# Patient Record
Sex: Male | Born: 1990 | Race: White | Hispanic: No | Marital: Single | Smoking: Never smoker
Health system: Southern US, Community
[De-identification: ages and names within clinical notes are randomized; demographics above are authoritative.]

---
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
## 2008-06-25 ENCOUNTER — Emergency Department: Payer: Self-pay | Admitting: Internal Medicine

## 2011-05-21 ENCOUNTER — Emergency Department: Payer: Self-pay | Admitting: Emergency Medicine

## 2011-05-21 LAB — URINALYSIS, COMPLETE
Bacteria: NONE SEEN
Bilirubin,UR: NEGATIVE
Blood: NEGATIVE
Glucose,UR: NEGATIVE mg/dL (ref 0–75)
Ketone: NEGATIVE
Ph: 6 (ref 4.5–8.0)
Protein: NEGATIVE
RBC,UR: 2 /HPF (ref 0–5)
Specific Gravity: 1.027 (ref 1.003–1.030)
Squamous Epithelial: NONE SEEN
WBC UR: 4 /HPF (ref 0–5)

## 2011-05-21 LAB — COMPREHENSIVE METABOLIC PANEL
Albumin: 4.2 g/dL (ref 3.4–5.0)
Alkaline Phosphatase: 60 U/L (ref 50–136)
Anion Gap: 8 (ref 7–16)
BUN: 11 mg/dL (ref 7–18)
Chloride: 105 mmol/L (ref 98–107)
Co2: 29 mmol/L (ref 21–32)
EGFR (African American): >60
EGFR (Non-African Amer.): >60
Glucose: 100 mg/dL — ABNORMAL HIGH (ref 65–99)
Potassium: 3.7 mmol/L (ref 3.5–5.1)
SGOT(AST): 30 U/L (ref 15–37)
SGPT (ALT): 25 U/L
Sodium: 142 mmol/L (ref 136–145)

## 2011-05-21 LAB — CBC
HCT: 43.5 % (ref 40.0–52.0)
MCHC: 33.8 g/dL (ref 32.0–36.0)
Platelet: 203 10*3/uL (ref 150–440)
RBC: 4.75 10*6/uL (ref 4.40–5.90)
WBC: 11.7 10*3/uL — ABNORMAL HIGH (ref 3.8–10.6)

## 2013-07-08 ENCOUNTER — Ambulatory Visit: Payer: Self-pay

## 2013-09-03 IMAGING — CT CT ABD-PELV W/ CM
1 of 2 series · 15 of 32 positions shown, 19 images · non-contrast
Comparison: none

REASON FOR EXAM: (1) Lower abdominal pain; (2) lower abdominal pain;
NOTE: Nursing to Give Don Lolito
COMMENTS:

PROCEDURE:     CT  - CT ABDOMEN / PELVIS  W  - May 21, 2011  [DATE]
RESULT:
TECHNIQUE: Helical 3 mm sections were obtained from the lung bases through
the pubic symphysis status post intravenous administration of 100 mL of
Wsovue-WNN and oral contrast.

[Series 2: appendicitis · axial · 0.70mm/px · z∈[-1216,-787]mm · 15 of 157 slices shown, 19 images]
[im 7/157  soft-tissue]
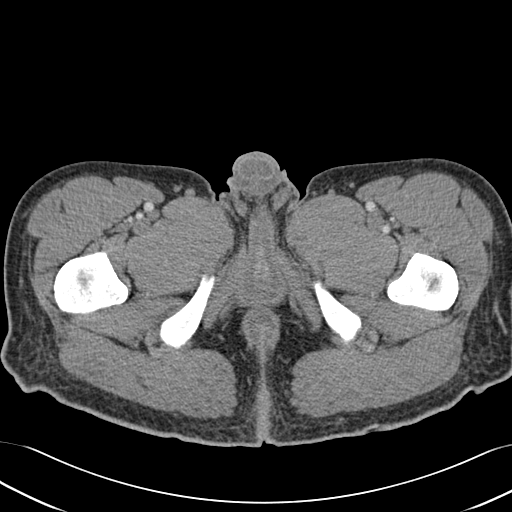
[im 7/157  bone]
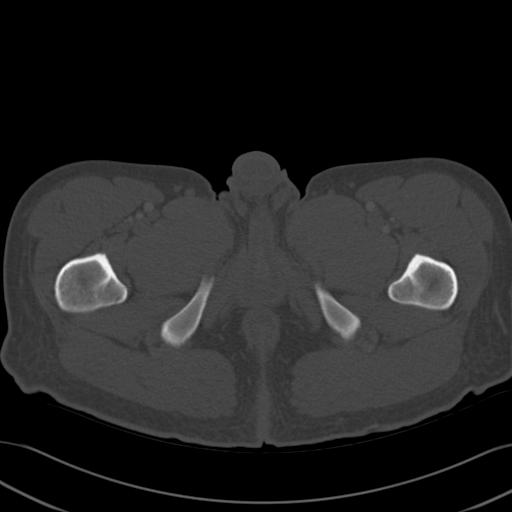
[im 19/157  soft-tissue]
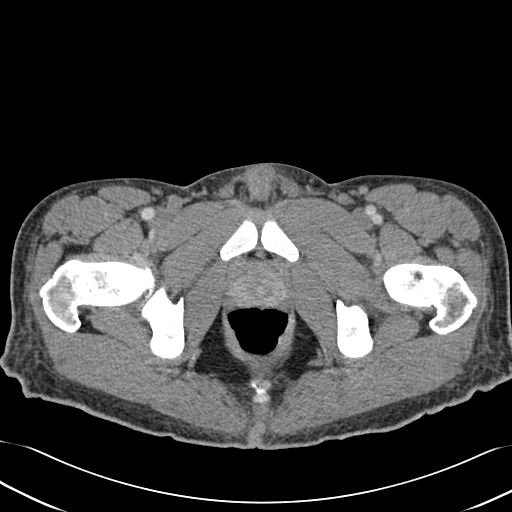
[im 32/157  soft-tissue]
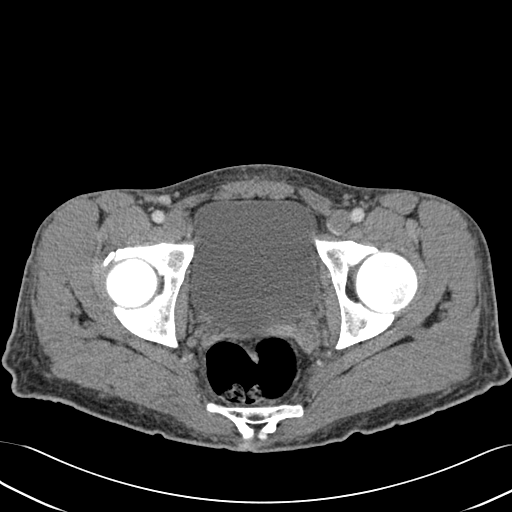
[im 44/157  soft-tissue]
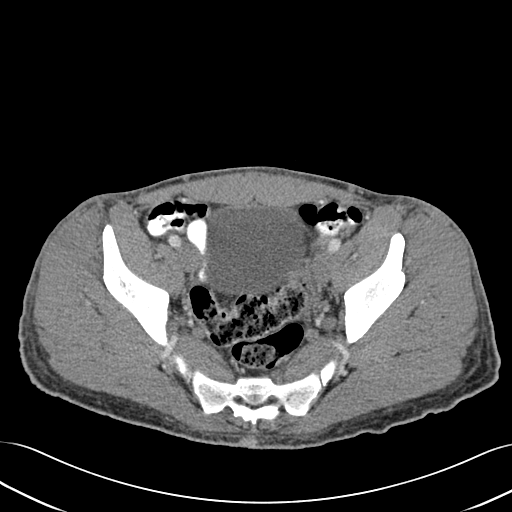
[im 57/157  soft-tissue]
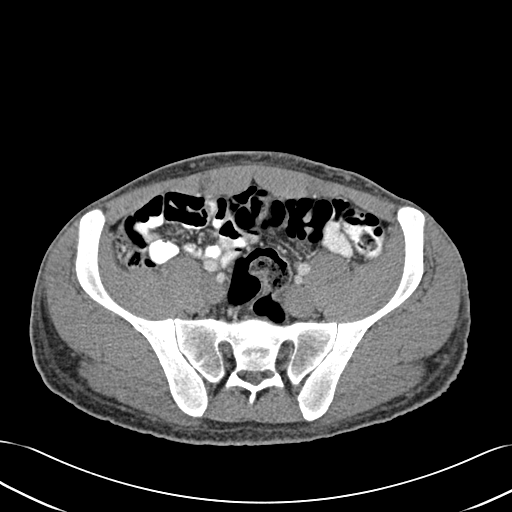
[im 69/157  soft-tissue]
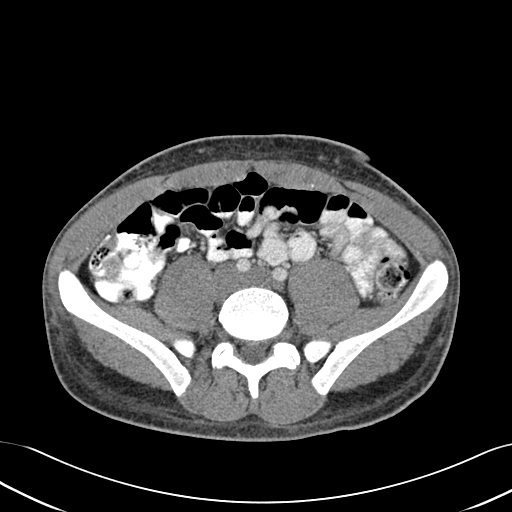
[im 82/157  soft-tissue]
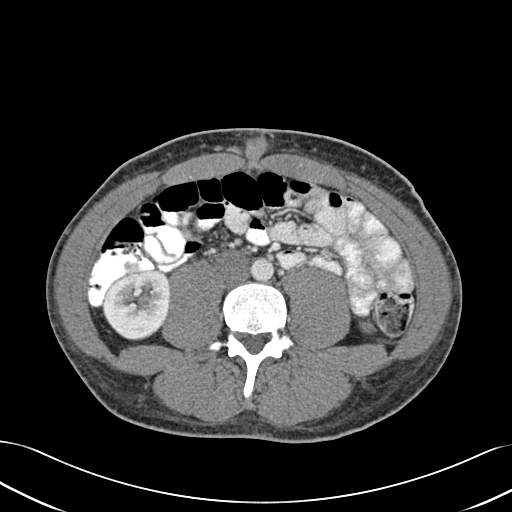
[im 88/157  soft-tissue]
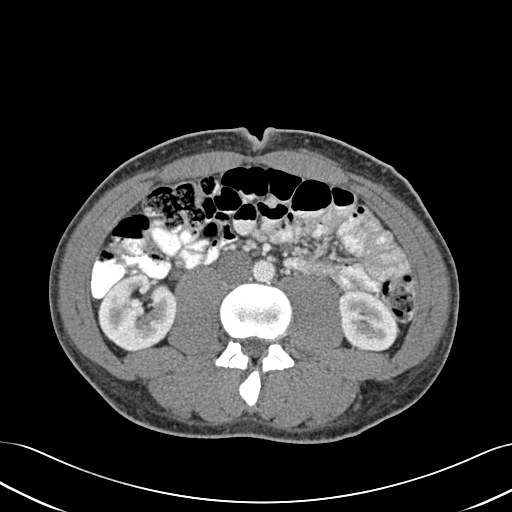
[im 100/157  soft-tissue]
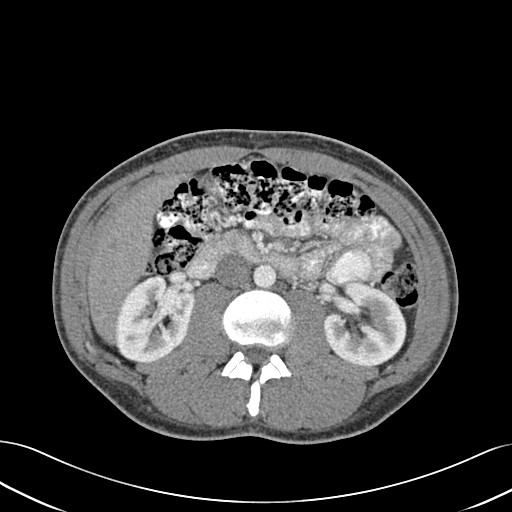
[im 100/157  bone]
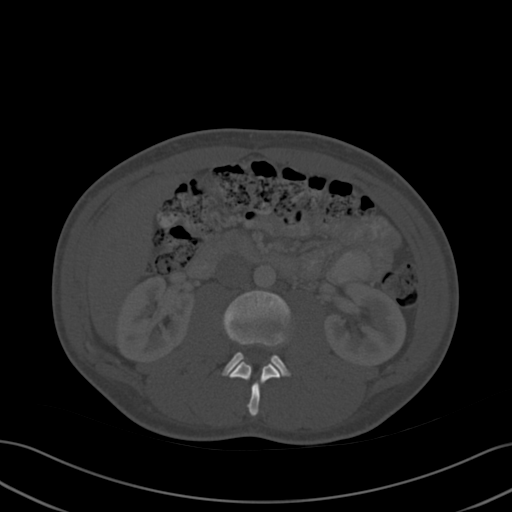
[im 113/157  soft-tissue]
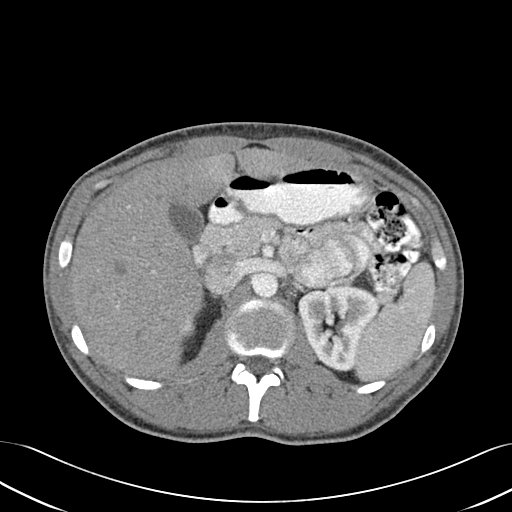
[im 125/157  soft-tissue]
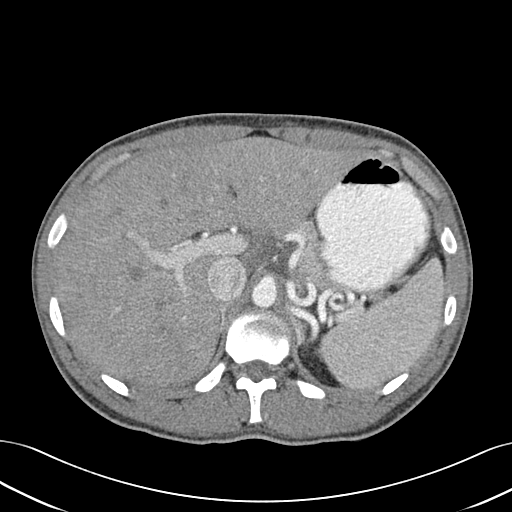
[im 132/157  lung]
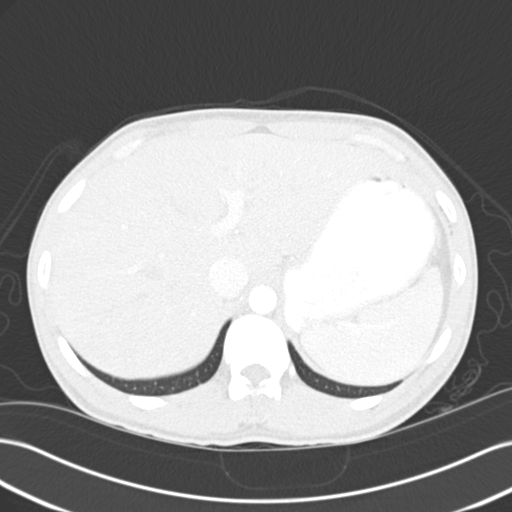
[im 138/157  soft-tissue]
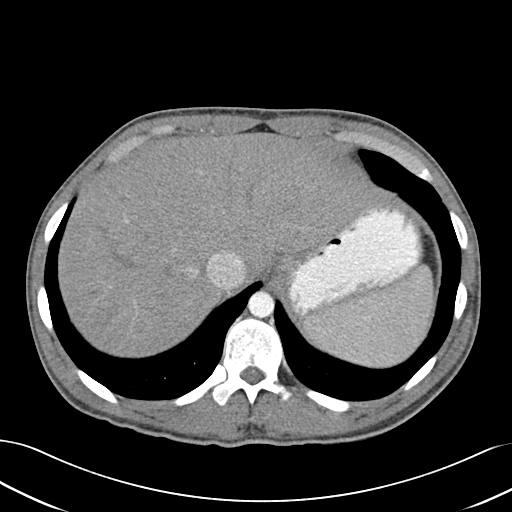
[im 138/157  lung]
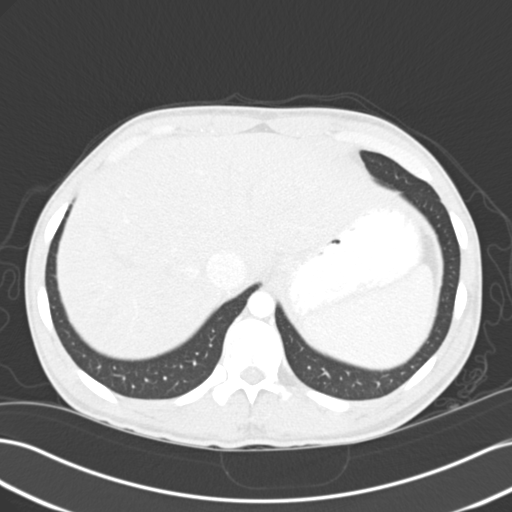
[im 144/157  lung]
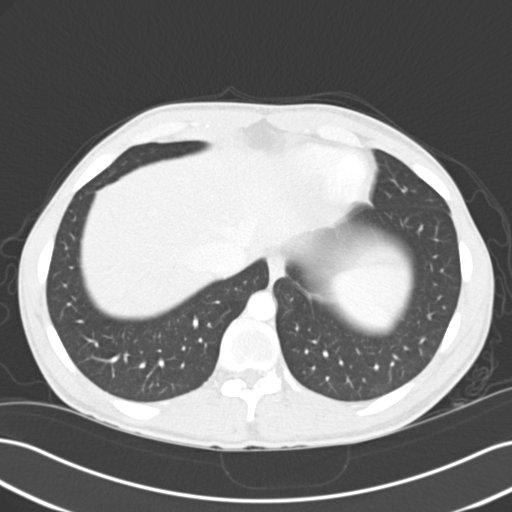
[im 150/157  soft-tissue]
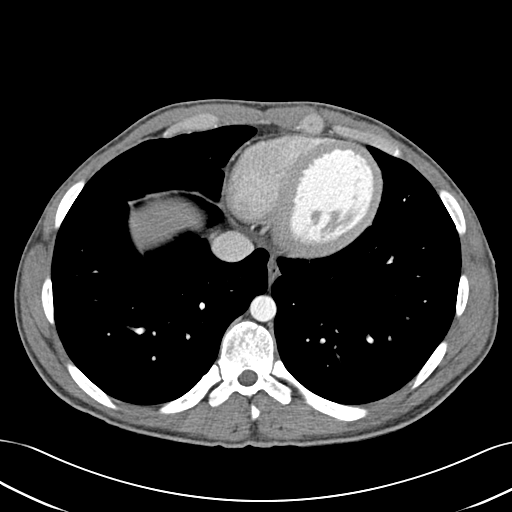
[im 150/157  lung]
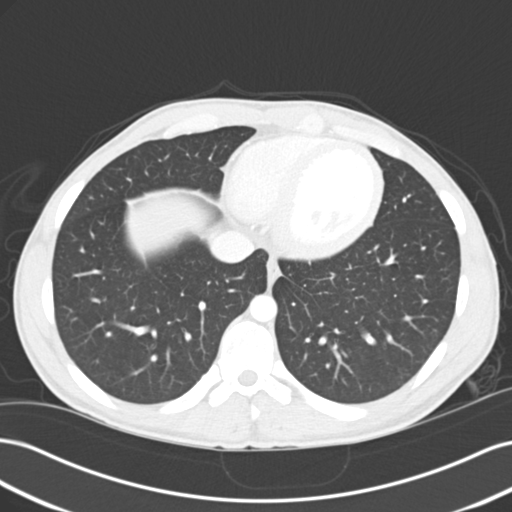

[15 of 32 positions shown; findings below may reference images not displayed]

FINDINGS: The lung bases are unremarkable.

The liver, spleen, adrenals, pancreas, and kidneys are unremarkable. There
is no CT evidence of appendicitis, colitis, diverticulitis, enteritis nor
bowel obstruction. There is no evidence of an abdominal aortic aneurysm. The
appendix is identified and appears unremarkable. There is no evidence of
abdominal or pelvic masses, free fluid nor loculated fluid collections.
IMPRESSION: 1. No CT evidence of obstructive or inflammatory abnormalities.
2. Dr. Puppi of the Emergency Department was informed of these findings
via a preliminary faxed report.

## 2014-01-09 ENCOUNTER — Emergency Department: Payer: Self-pay | Admitting: Emergency Medicine

## 2014-01-30 ENCOUNTER — Emergency Department: Payer: Self-pay | Admitting: Emergency Medicine

## 2016-05-15 ENCOUNTER — Encounter: Payer: Self-pay | Admitting: Emergency Medicine

## 2016-05-15 ENCOUNTER — Emergency Department
Admission: EM | Admit: 2016-05-15 | Discharge: 2016-05-15 | Disposition: A | Discharge status: Home / Self Care | Payer: Self-pay | Attending: Emergency Medicine | Admitting: Emergency Medicine

## 2016-05-15 DIAGNOSIS — K0381 Cracked tooth: Secondary | ICD-10-CM | POA: Insufficient documentation

## 2016-05-15 DIAGNOSIS — K029 Dental caries, unspecified: Secondary | ICD-10-CM | POA: Insufficient documentation

## 2016-05-15 IMAGING — US US SCROTUM W/ DOPPLER COMPLETE
1 series · 14 of 25 positions shown · non-contrast
Comparison: None.

CLINICAL DATA: Testicular pain, right greater than left, x1 day.

EXAM:
SCROTAL ULTRASOUND
DOPPLER ULTRASOUND OF THE TESTICLES
TECHNIQUE: Complete ultrasound examination of the testicles, epididymis, and
other scrotal structures was performed. Color and spectral Doppler
ultrasound were also utilized to evaluate blood flow to the
testicles.

[Series 1: us scrotum w/ doppler complete · 0.06mm/px · 14 of 59 slices shown]
[im 1/59]
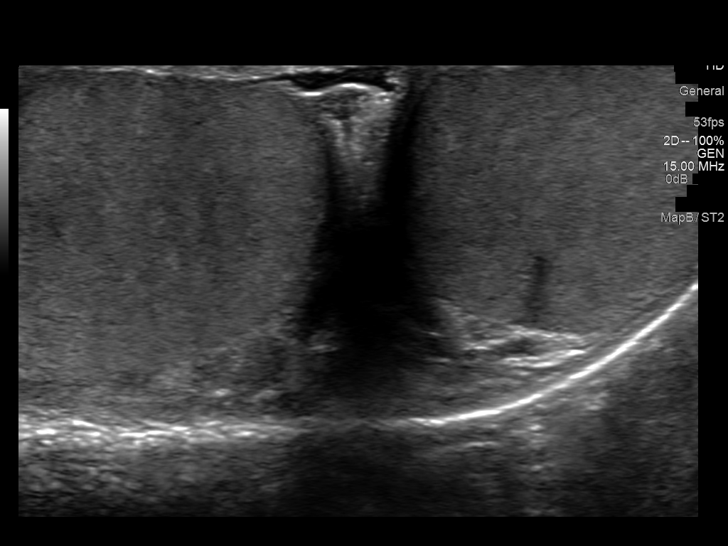
[im 5/59]
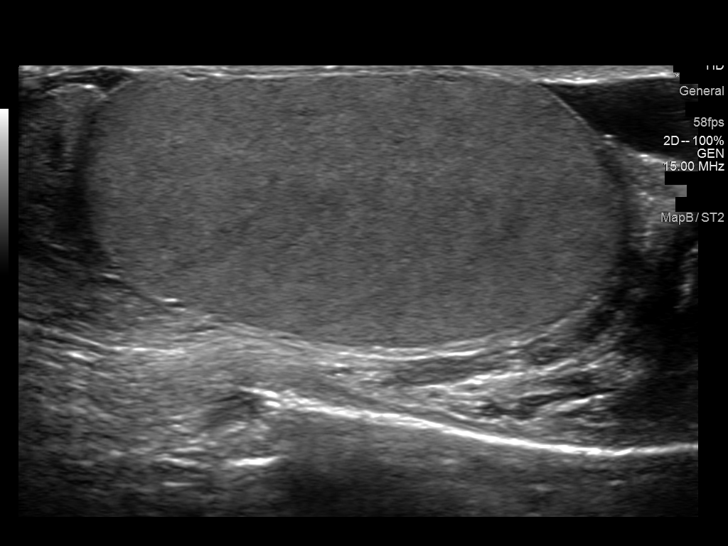
[im 10/59]
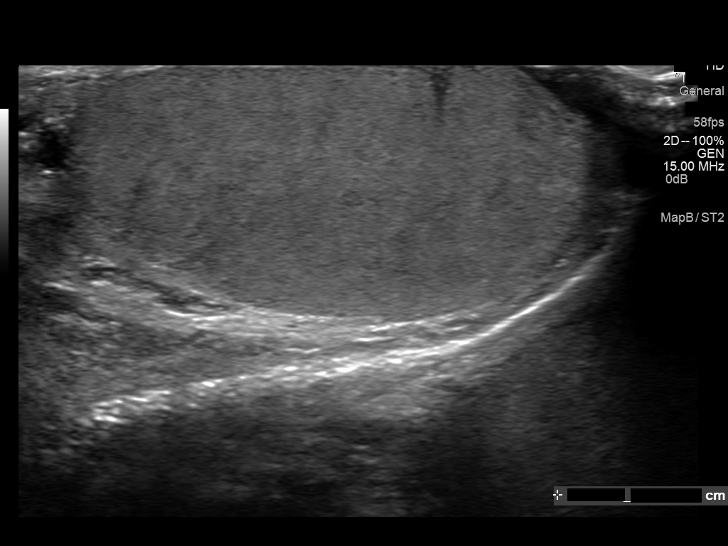
[im 15/59]
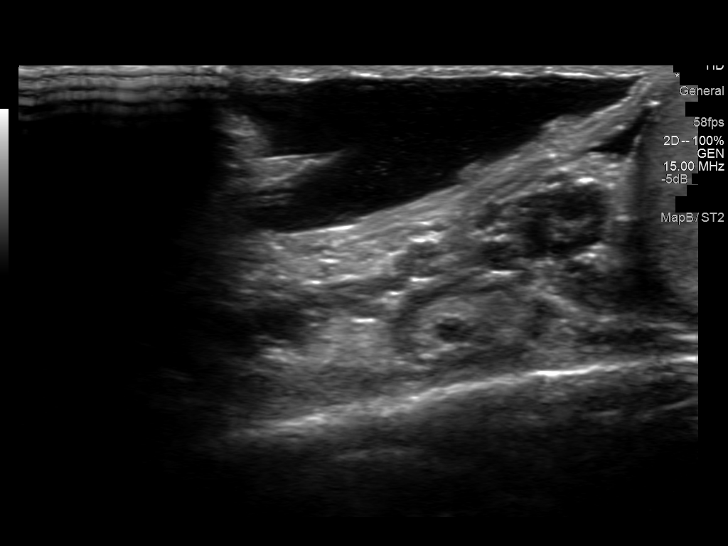
[im 20/59]
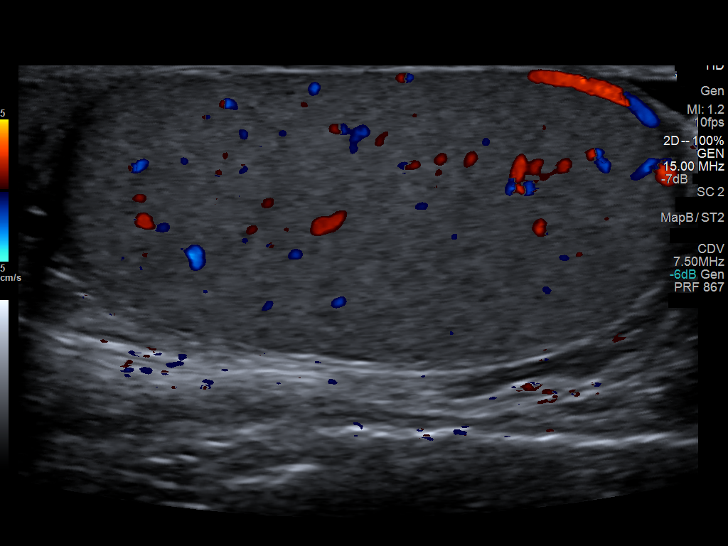
[im 22/59]
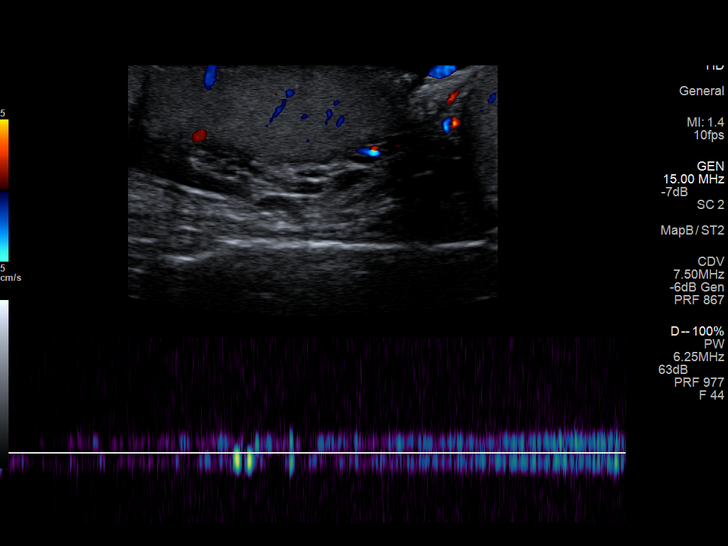
[im 27/59]
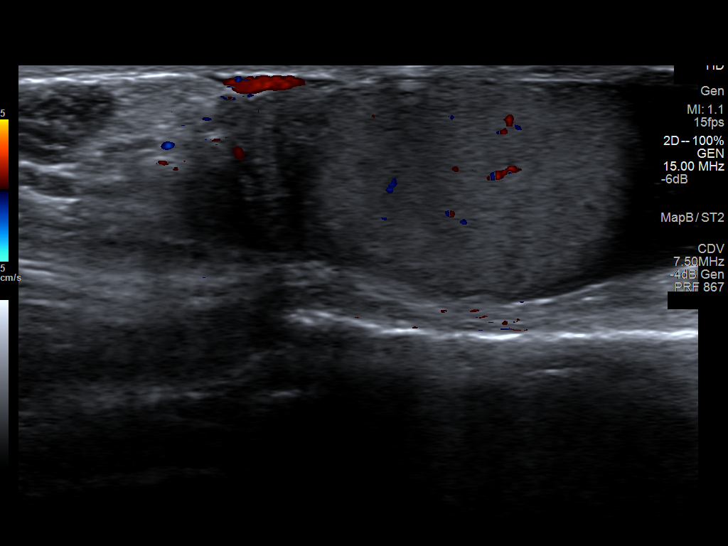
[im 32/59]
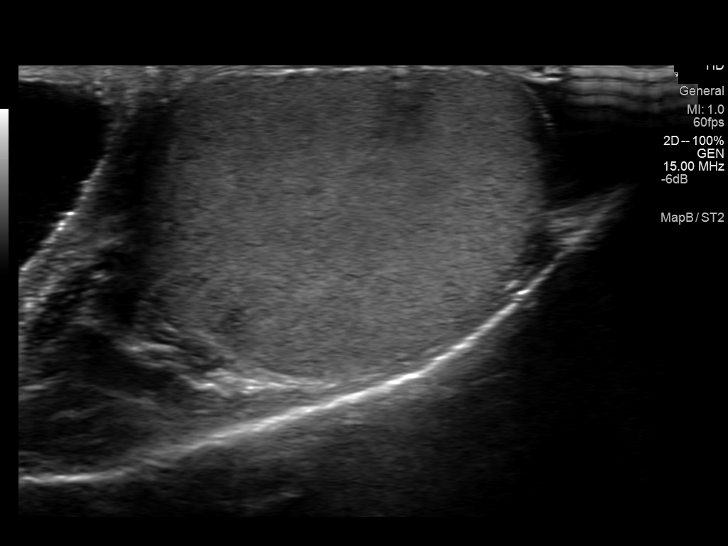
[im 37/59]
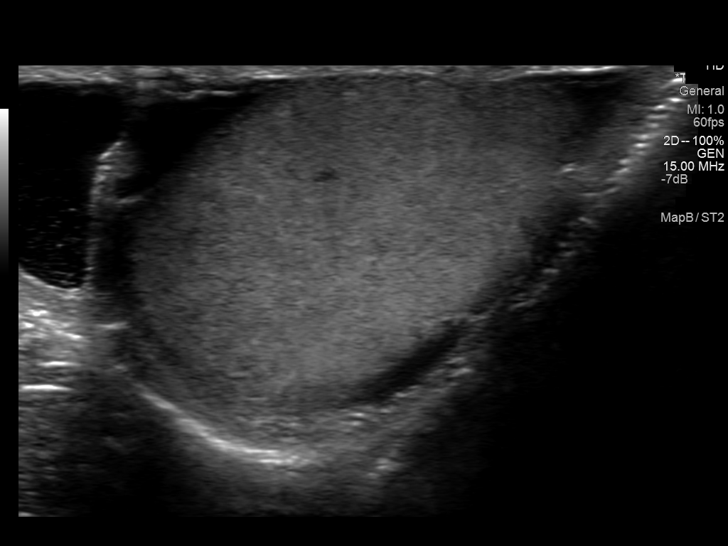
[im 39/59]
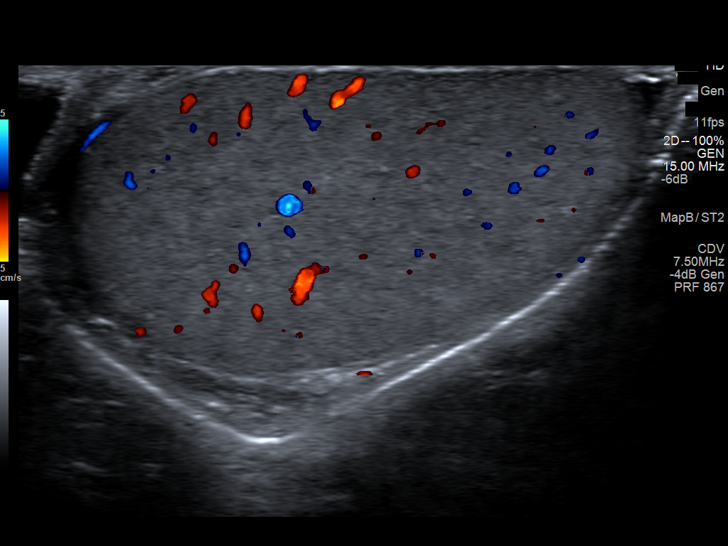
[im 44/59]
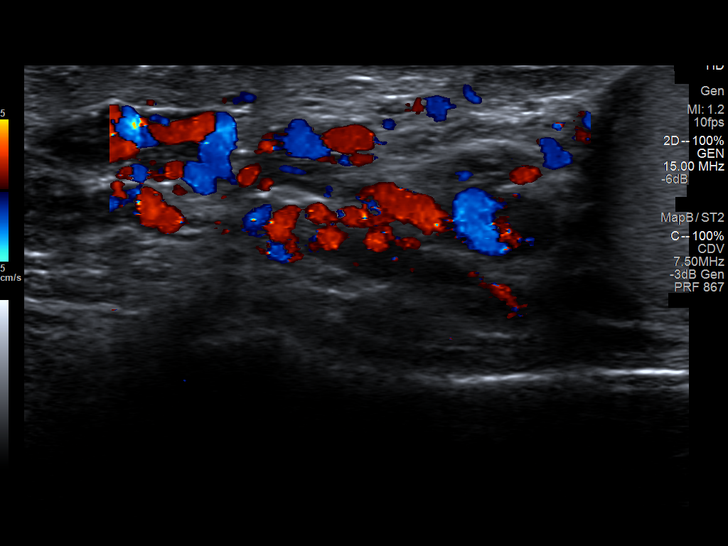
[im 49/59]
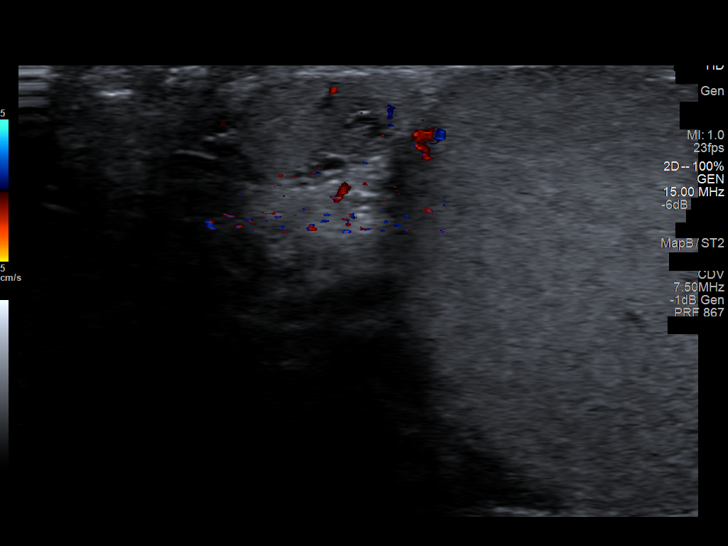
[im 54/59]
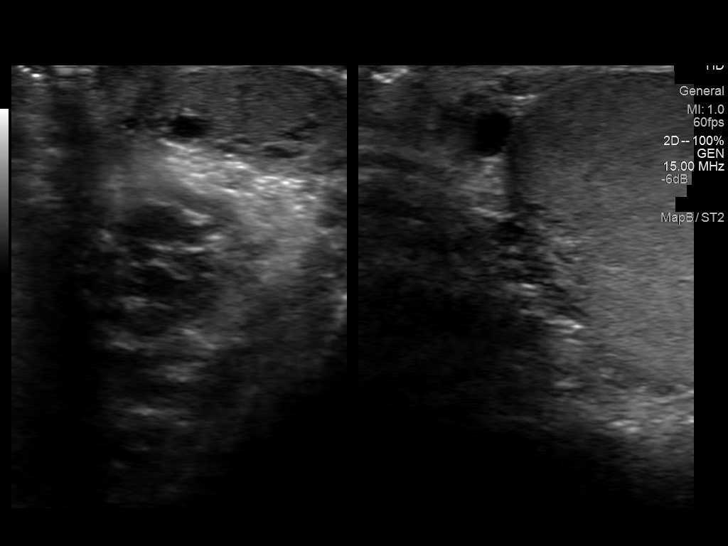
[im 59/59]
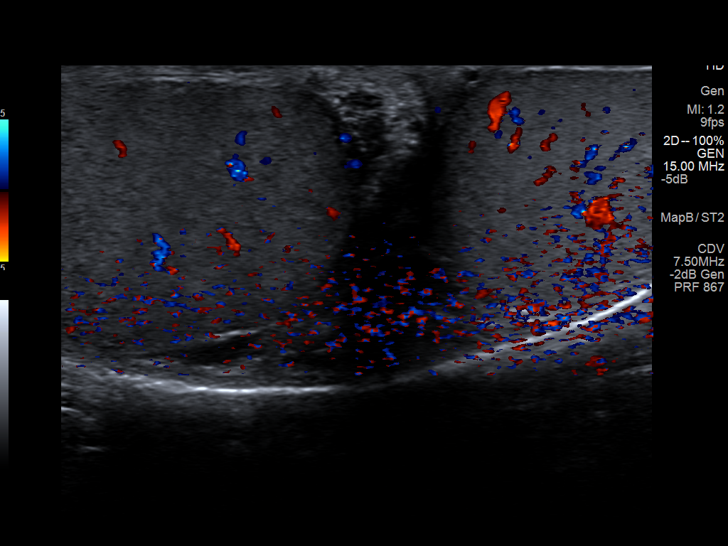

[14 of 25 positions shown; findings below may reference images not displayed]

FINDINGS: Right testicle

Measurements: 4.7 x 2.3 x 3.4 cm. No mass or microlithiasis
visualized.

Left testicle

Measurements: 4.4 x 2.2 x 2.8 cm. No mass or microlithiasis
visualized.

Right epididymis:  Normal in size and appearance.

Left epididymis: Multiple tiny epididymal cysts measuring up to 3
mm.

Hydrocele:  Small bilateral hydroceles, right greater than left.

Varicocele:  Present on the left.

Pulsed Doppler interrogation of both testes demonstrates low
resistance arterial and venous waveforms bilaterally.
IMPRESSION: Normal sonographic appearance of the bilateral testes.

No evidence of testicular torsion.

Small bilateral hydroceles.

Left varicocele.

## 2016-05-15 MED ORDER — TRAMADOL HCL 50 MG PO TABS
50.0000 mg | ORAL_TABLET | Freq: Once | ORAL | Status: AC
  Administered 2016-05-15: 50 mg via ORAL
  Filled 2016-05-15: qty 1

## 2016-05-15 MED ORDER — IBUPROFEN 600 MG PO TABS
600.0000 mg | ORAL_TABLET | Freq: Once | ORAL | Status: AC
  Administered 2016-05-15: 600 mg via ORAL
  Filled 2016-05-15: qty 1

## 2016-05-15 MED ORDER — TRAMADOL HCL 50 MG PO TABS: 50.0000 mg | ORAL_TABLET | Freq: Four times a day (QID) | ORAL | 0 refills | Status: DC | PRN

## 2016-05-15 MED ORDER — LIDOCAINE VISCOUS 2 % MT SOLN
15.0000 mL | Freq: Once | OROMUCOSAL | Status: AC
  Administered 2016-05-15: 15 mL via OROMUCOSAL
  Filled 2016-05-15: qty 15

## 2016-05-15 MED ORDER — AMOXICILLIN 500 MG PO CAPS: 500.0000 mg | ORAL_CAPSULE | Freq: Three times a day (TID) | ORAL | 0 refills | Status: DC

## 2016-05-15 NOTE — ED Notes (Signed)
AAOx3.  Skin warm and dry.  NAD 

## 2016-05-15 NOTE — ED Triage Notes (Signed)
Pt c/o right lower and upper dental pain for 3 weeks. No fevers. No swelling.

## 2016-05-15 NOTE — ED Provider Notes (Signed)
[facility] Emergency Department Provider Note   ____________________________________________   None    (approximate)  I have reviewed the triage vital signs and the nursing notes.   HISTORY  Chief Complaint Dental Pain    HPI Thomas Chambers is a 26 y.o. male . Patient complaining of upper and lower right dental pain for 3 weeks. Patient has a history of devitalized teeth. Patient stated that the hair and of insurance or money to pay for dentist. Patient rates his pain as a 10 over 10. No palliative measures for his complaint.  History reviewed. No pertinent past medical history.  There are no active problems to display for this patient.   History reviewed. No pertinent surgical history.  Prior to Admission medications   Medication Sig Start Date End Date Taking? Authorizing Provider  amoxicillin (AMOXIL) 500 MG capsule Take 1 capsule (500 mg total) by mouth 3 (three) times daily. 05/15/16   Joni Reining, PA-C  traMADol (ULTRAM) 50 MG tablet Take 1 tablet (50 mg total) by mouth every 6 (six) hours as needed for moderate pain. 05/15/16   Joni Reining, PA-C    Allergies Other  History reviewed. No pertinent family history.  Social History Social History  Substance Use Topics  . Smoking status: Never Smoker  . Smokeless tobacco: Never Used  . Alcohol use No    Review of Systems Constitutional: No fever/chills Eyes: No visual changes. ENT: No sore throat.Dental pain Cardiovascular: Denies chest pain. Respiratory: Denies shortness of breath. Gastrointestinal: No abdominal pain.  No nausea, no vomiting.  No diarrhea.  No constipation. Genitourinary: Negative for dysuria. Musculoskeletal: Negative for back pain. Skin: Negative for rash. Neurological: Negative for headaches, focal weakness or numbness.   ____________________________________________   PHYSICAL EXAM:  VITAL SIGNS: ED Triage Vitals  Enc Vitals Group     BP 05/15/16 1500  (!) 146/72     Pulse Rate 05/15/16 1459 71     Resp 05/15/16 1459 16     Temp 05/15/16 1459 97.7 F (36.5 C)     Temp Source 05/15/16 1459 Oral     SpO2 05/15/16 1459 100 %     Weight 05/15/16 1457 200 lb (90.7 kg)     Height 05/15/16 1457  (1.753 m)     Head Circumference --      Peak Flow --      Pain Score 05/15/16 1455 10     Pain Loc --      Pain Edu? --      Excl. in GC? --     Constitutional: Alert and oriented. Well appearing and in no acute distress. Eyes: Conjunctivae are normal. PERRL. EOMI. Head: Atraumatic. Nose: No congestion/rhinnorhea. Mouth/Throat: Mucous membranes are moist.  Oropharynx non-erythematous.Mild gingival edema with multiple caries with fracture of the upper and lower teeth. Neck: No stridor.  No cervical spine tenderness to palpation. Hematological/Lymphatic/Immunilogical: No cervical lymphadenopathy. Cardiovascular: Normal rate, regular rhythm. Grossly normal heart sounds.  Good peripheral circulation. Respiratory: Normal respiratory effort.  No retractions. Lungs CTAB. Gastrointestinal: Soft and nontender. No distention. No abdominal bruits. No CVA tenderness. Musculoskeletal: No lower extremity tenderness nor edema.  No joint effusions. Neurologic:  Normal speech and language. No gross focal neurologic deficits are appreciated. No gait instability. Skin:  Skin is warm, dry and intact. No rash noted. Psychiatric: Mood and affect are normal. Speech and behavior are normal.  ____________________________________________   LABS (all labs ordered are listed, but only abnormal  results are displayed)  Labs Reviewed - No data to display ____________________________________________  EKG   ____________________________________________  RADIOLOGY   ____________________________________________   PROCEDURES  Procedure(s) performed: None  Procedures  Critical Care performed: No  ____________________________________________   INITIAL  IMPRESSION / ASSESSMENT AND PLAN / ED COURSE  Pertinent labs & imaging results that were available during my care of the patient were reviewed by me and considered in my medical decision making (see chart for details).  Dental pain. Patient given viscous lidocaine for swish followed by tramadol and ibuprofen.      ____________________________________________   FINAL CLINICAL IMPRESSION(S) / ED DIAGNOSES  Final diagnoses:  Pain due to dental caries  Patient given discharge care instructions. Patient advised to follow-up at the walk-in dental clinic tomorrow morning. Patient given a prescription for 2 days of tramadol and 10 days for amoxicillin.    NEW MEDICATIONS STARTED DURING THIS VISIT:  New Prescriptions   AMOXICILLIN (AMOXIL) 500 MG CAPSULE    Take 1 capsule (500 mg total) by mouth 3 (three) times daily.   TRAMADOL (ULTRAM) 50 MG TABLET    Take 1 tablet (50 mg total) by mouth every 6 (six) hours as needed for moderate pain.     Note:  This document was prepared using Dragon voice recognition software and may include unintentional dictation errors.    Joni Reining, PA-C 05/15/16 1538    Joni Reining, PA-C 05/15/16 1541    Emily Filbert, MD 05/16/16 [phone]

## 2018-01-31 ENCOUNTER — Encounter: Payer: Self-pay | Admitting: Emergency Medicine

## 2018-01-31 ENCOUNTER — Emergency Department
Admission: EM | Admit: 2018-01-31 | Discharge: 2018-01-31 | Disposition: A | Discharge status: Home / Self Care | Payer: Self-pay | Attending: Emergency Medicine | Admitting: Emergency Medicine

## 2018-01-31 ENCOUNTER — Other Ambulatory Visit: Payer: Self-pay

## 2018-01-31 DIAGNOSIS — L509 Urticaria, unspecified: Secondary | ICD-10-CM | POA: Insufficient documentation

## 2018-01-31 DIAGNOSIS — L508 Other urticaria: Secondary | ICD-10-CM

## 2018-01-31 MED ORDER — PREDNISONE 20 MG PO TABS: 20.0000 mg | ORAL_TABLET | Freq: Two times a day (BID) | ORAL | 0 refills | Status: AC

## 2018-01-31 MED ORDER — METHYLPREDNISOLONE SODIUM SUCC 125 MG IJ SOLR
125.0000 mg | Freq: Once | INTRAMUSCULAR | Status: AC
  Administered 2018-01-31: 125 mg via INTRAVENOUS
  Filled 2018-01-31: qty 2

## 2018-01-31 MED ORDER — CYPROHEPTADINE HCL 4 MG PO TABS
4.0000 mg | ORAL_TABLET | Freq: Three times a day (TID) | ORAL | 0 refills | Status: AC | PRN
Start: 2018-01-31 — End: 2018-02-07

## 2018-01-31 MED ORDER — RANITIDINE HCL 150 MG PO TABS: 150.0000 mg | ORAL_TABLET | Freq: Two times a day (BID) | ORAL | 0 refills | Status: AC

## 2018-01-31 MED ORDER — FAMOTIDINE IN NACL 20-0.9 MG/50ML-% IV SOLN
20.0000 mg | Freq: Once | INTRAVENOUS | Status: AC
Start: 2018-01-31 — End: 2018-01-31
  Administered 2018-01-31: 20 mg via INTRAVENOUS
  Filled 2018-01-31: qty 50

## 2018-01-31 MED ORDER — DIPHENHYDRAMINE HCL 50 MG/ML IJ SOLN
50.0000 mg | Freq: Once | INTRAMUSCULAR | Status: AC
  Administered 2018-01-31: 50 mg via INTRAVENOUS
  Filled 2018-01-31: qty 1

## 2018-01-31 NOTE — ED Triage Notes (Signed)
Hives x 3 days. States started after clothes were washed with stain remover. resp unablored

## 2018-01-31 NOTE — Discharge Instructions (Addendum)
You have been treated for a contact dermatitis due to a chemical exposure. Take the prescription meds as directed. Avoid excessive heat and cold exposure. Follow-up with your provider or return as needed.

## 2018-01-31 NOTE — ED Provider Notes (Signed)
[facility] Emergency Department Provider Note ____________________________________________  Time seen: 1620  I have reviewed the triage vital signs and the nursing notes.  HISTORY  Chief Complaint  Urticaria  HPI Thomas Chambers is a 27 y.o. male who presents to the ED for several hours of itching and hives.  Patient describes onset after he donned some clothing that his girlfriend had used a stain remover product on.  He has had hives that have waxed and waned to the extremities, trunk, and groin region he also describes some itching to the neck and back of the scalp.  He presented this afternoon after he began to experience what he felt was some tightening of his throat.  He denies any shortness of breath, chest pain, or syncope.  He also denies any inability to control his oral secretions.  [facility] without [facility]oking or vomiting.  Denies any known history of skin sensitivity, asthma, allergies, or atopic dermatitis.  History reviewed. No pertinent past medical history.  There are no active problems to display for this patient.  History reviewed. No pertinent surgical history.  Prior to Admission medications   Medication Sig Start Date End Date Taking? Authorizing Provider  cyproheptadine (PERIACTIN) 4 MG tablet Take 1 tablet (4 mg total) by mouth 3 (three) times daily as needed for up to 7 days for allergies. 01/31/18 02/07/18  Kharisma Glasner, Charlesetta IvoryJenise V Bacon, PA-C  predniSONE (DELTASONE) 20 MG tablet Take 1 tablet (20 mg total) by mouth 2 (two) times daily with a meal for 5 days. 01/31/18 02/05/18  Brentin Shin, Charlesetta IvoryJenise V Bacon, PA-C  ranitidine (ZANTAC) 150 MG tablet Take 1 tablet (150 mg total) by mouth 2 (two) times daily for 7 days. 01/31/18 02/07/18  Giulia Hickey, Charlesetta IvoryJenise V Bacon, PA-C    Allergies Other  No family history on file.  Social History Social History   Tobacco Use  . Smoking status: Never Smoker  . Smokeless tobacco: Never Used  Substance Use  Topics  . Alcohol use: No  . Drug use: No    Review of Systems  Constitutional: Negative for fever. Eyes: Negative for visual changes. ENT: Negative for sore throat. Cardiovascular: Negative for chest pain. Respiratory: Negative for shortness of breath. Gastrointestinal: Negative for abdominal pain, vomiting and diarrhea. Genitourinary: Negative for dysuria. Musculoskeletal: Negative for back pain. Skin: Positive for rash. Neurological: Negative for headaches, focal weakness or numbness. ____________________________________________  PHYSICAL EXAM:  VITAL SIGNS: ED Triage Vitals  Enc Vitals Group     BP 01/31/18 1529 (!) 162/93     Pulse Rate 01/31/18 1529 99     Resp 01/31/18 1529 20     Temp 01/31/18 1529 (!) 97.5 F (36.4 C)     Temp Source 01/31/18 1529 Oral     SpO2 01/31/18 1529 100 %     Weight 01/31/18 1530 240 lb (108.9 kg)     Height 01/31/18 1530 5\' 8"  (1.727 m)     Head Circumference --      Peak Flow --      Pain Score 01/31/18 1530 0     Pain Loc --      Pain Edu? --      Excl. in GC? --     Constitutional: Alert and oriented. Well appearing and in no distress. Head: Normocephalic and atraumatic. Eyes: Conjunctivae are normal. Normal extraocular movements Ears: Canals clear. TMs intact bilaterally. Nose: No congestion/rhinorrhea/epistaxis. Mouth/Throat: Mucous membranes are moist.  Uvula is midline and tonsils are  flat.  No sublingual edema or erythema is noted. Neck: Supple. No thyromegaly. Hematological/Lymphatic/Immunological: No cervical lymphadenopathy. Cardiovascular: Normal rate, regular rhythm. Normal distal pulses. Respiratory: Normal respiratory effort. No wheezes/rales/rhonchi. Gastrointestinal: Soft and nontender. No distention. Musculoskeletal: Nontender with normal range of motion in all extremities.  Neurologic:  Normal gait without ataxia. Normal speech and language. No gross focal neurologic deficits are appreciated. Skin:  Skin is  warm, dry and intact.  Patient with multiple whelps consistent with urticaria noted to the extremities and trunk. ____________________________________________  PROCEDURES  Procedures Saline IV lock Diphenhydramine 50 mg IVP Solumedrol 125 mg IVP Famotidine 20 mg IVPB ____________________________________________  INITIAL IMPRESSION / ASSESSMENT AND PLAN / ED COURSE  Patient with ED evaluation of hives secondary to contact dermatitis.  Patient describes contact to a laundry pretreatment product.  He describes 3 days of waxing and waning hives and itching.  Patient's clinical picture is consistent with an allergic contact dermatitis.  He reports improvement of his symptoms after IV administration of antihistamines and steroids.  Patient will be discharged with a prescription for prednisone, ranitidine, and Periactin.  He is encouraged to continue to monitor and treat symptoms as necessary and return to the ED should symptoms worsen sharply. ____________________________________________  FINAL CLINICAL IMPRESSION(S) / ED DIAGNOSES  Final diagnoses:  Urticaria, acute      Karmen StabsMenshew, Charlesetta IvoryJenise V Bacon, PA-C 01/31/18 1813    Emily FilbertWilliams, Jonathan E, MD 01/31/18 [phone]

## 2021-02-21 ENCOUNTER — Emergency Department
Admission: EM | Admit: 2021-02-21 | Discharge: 2021-02-21 | Disposition: A | Discharge status: Home / Self Care | Payer: Self-pay | Attending: Emergency Medicine | Admitting: Emergency Medicine

## 2021-02-21 ENCOUNTER — Other Ambulatory Visit: Payer: Self-pay

## 2021-02-21 ENCOUNTER — Encounter: Payer: Self-pay | Admitting: Emergency Medicine

## 2021-02-21 DIAGNOSIS — K029 Dental caries, unspecified: Secondary | ICD-10-CM | POA: Insufficient documentation

## 2021-02-21 MED ORDER — IBUPROFEN 800 MG PO TABS: 800.0000 mg | ORAL_TABLET | Freq: Three times a day (TID) | ORAL | 0 refills | Status: AC | PRN

## 2021-02-21 MED ORDER — IBUPROFEN 800 MG PO TABS
800.0000 mg | ORAL_TABLET | Freq: Once | ORAL | Status: AC
  Administered 2021-02-21: 800 mg via ORAL
  Filled 2021-02-21: qty 1

## 2021-02-21 MED ORDER — LIDOCAINE VISCOUS HCL 2 % MT SOLN
15.0000 mL | Freq: Once | OROMUCOSAL | Status: AC
  Administered 2021-02-21: 15 mL via OROMUCOSAL
  Filled 2021-02-21: qty 15

## 2021-02-21 MED ORDER — LIDOCAINE VISCOUS HCL 2 % MT SOLN: 15.0000 mL | OROMUCOSAL | 0 refills | Status: AC | PRN

## 2021-02-21 MED ORDER — AMOXICILLIN 500 MG PO TABS: 500.0000 mg | ORAL_TABLET | Freq: Two times a day (BID) | ORAL | 0 refills | Status: AC

## 2021-02-21 NOTE — ED Triage Notes (Signed)
Patient ambulatory to triage with steady gait, without difficulty or distress noted; pt reports rt sided facial/mouth pain "for awhile", worse today

## 2021-02-21 NOTE — Discharge Instructions (Signed)
OPTIONS FOR DENTAL FOLLOW UP CARE ° °[place] [facility] and [facility] - Local Safety Net Dental Clinics °http://www.ncdhhs.gov/dph/oralhealth/services/safetynetclinics.htm °  °[facility] (336-562-3123) ° °[facility] [place] (919-933-9087) ° °[facility] ([phone] ext [phone]) ° °[facility] ([phone]) ° °[facility] ([phone]) °This clinic caters to the indigent population and is on a lottery system. °Location: °[facility], [address], [place] °Clinic Hours: °Wednesdays from 6pm - 9pm, patients seen by a lottery system. °For dates, call or go to [email or web address].edu/[facility]/patients/[email or web address] °Services: °Cleanings, fillings and simple extractions. °Payment Options: °DENTAL WORK IS FREE OF CHARGE. Bring proof of income or support. °Best way to get seen: °Arrive at 5:15 pm - this is a lottery, NOT first come/first serve, so arriving earlier will not increase your chances of being seen. °  °  °[facility] °[phone] °Select option 1 for emergencies °  °Location: °[facility], [address], [place] °Clinic Hours: °No walk-ins accepted - call the day before to schedule an appointment. °Check in times are 9:30 am and 1:30 pm. °Services: °Simple extractions, temporary fillings, pulpectomy/pulp debridement, uncomplicated abscess drainage. °Payment Options: °PAYMENT IS DUE AT THE TIME OF SERVICE.  Fee is usually $100-200, additional surgical procedures (e.g. abscess drainage) may be extra. °Cash, checks, Visa/MasterCard accepted.  Can file Medicaid if patient is covered for dental - patient should call case worker to check. °No discount for [facility] patients. °Best way to get seen: °MUST call the day before and get onto the schedule. Can usually be seen the next 1-2 days. No walk-ins accepted. °  °  °[facility] °[phone] °   °Location: °[facility], [address], [place] °Clinic Hours: °M, W, Th, F 8am or 1:30pm, Tues 9a or 1:30 - first come/first served. °Services: °Simple extractions, temporary fillings, uncomplicated abscess drainage.  You do not need to be an [place] resident. °Payment Options: °PAYMENT IS DUE AT THE TIME OF SERVICE. °Dental insurance, otherwise sliding scale - bring proof of income or support. °Depending on income and treatment needed, cost is usually $50-200. °Best way to get seen: °Arrive early as it is first come/first served. °  °  °[facility] °[phone] °  °Location: °[address] °Clinic Hours: °Mon-Thu 8a-5p °Services: °Most basic dental services including extractions and fillings. °Payment Options: °PAYMENT IS DUE AT THE TIME OF SERVICE. °Sliding scale, up to 50% off - bring proof if income or support. °Medicaid with dental option accepted. °Best way to get seen: °Call to schedule an appointment, can usually be seen within 2 weeks OR they will try to see walk-ins - show up at 8a or 2p (you may have to wait). °  °  °[facility] °919-245-2435 °[place] RESIDENTS ONLY °  °Location: °[facility], [address], [place], [place] [address] °Clinic Hours: By appointment only. °Monday - Thursday 8am-5pm, Friday 8am-12pm °Services: Cleanings, fillings, extractions. °Payment Options: °PAYMENT IS DUE AT THE TIME OF SERVICE. °Cash, Visa or MasterCard. Sliding scale - $30 minimum per service. °Best way to get seen: °Come in to office, complete packet and make an appointment - need proof of income °or support monies for each household member and proof of [place] residence. °Usually takes about a month to get in. °  °  °[facility] °[phone] °  °Location: °[address]   [place] °Clinic Hours: Walk-in Urgent Care Dental Services are offered Monday-Friday  mornings only. °The numbers of emergencies accepted daily is limited to the number of °providers available. °Maximum 15 - Mondays, Wednesdays & Thursdays °Maximum 10 - Tuesdays & Fridays °Services: °You do not need to be a [place] resident to be seen for a dental emergency. °Emergencies are defined as pain, swelling, abnormal bleeding, or dental trauma. Walkins will receive x-rays if needed. °NOTE: Dental cleaning is not an emergency. °Payment Options: °PAYMENT IS DUE AT THE TIME OF SERVICE. °Minimum co-pay is $40.00 for uninsured patients. °Minimum co-pay is $3.00 for Medicaid with dental coverage. °Dental Insurance is accepted and must be presented at time of visit. °Medicare does not cover dental. °Forms of payment: Cash, credit card, checks. °Best way to get seen: °If not previously registered with the clinic, walk-in dental registration begins at 7:15 am and is on a first come/first serve basis. °If previously registered with the clinic, call to make an appointment. °  °  °[facility] °[phone] °[place] RESIDENTS ONLY °  °Location: °[address], [place], [place] °Clinic Hours: °Mon-Thu 10a-2p °Services: Extractions only! °Payment Options: °FREE (donations accepted) - bring proof of income or support °Best way to get seen: °Call and schedule an appointment OR come at 8am on the 1st Monday of every month (except for holidays) when it is first come/first served. °  °  °[facility] °[phone] °  °Location: °[address], [place] °Clinic Hours: °Friday mornings °Services, Payment Options, Best way to get seen: °Call for info °

## 2021-02-21 NOTE — ED Provider Notes (Signed)
[facility] Provider Note    Event Date/Time   First MD Initiated Contact with Patient 02/21/21 0410     (approximate)   History   Dental Pain   HPI  AHMAAD NEIDHARDT is a 31 y.o. male presents for evaluation of dental pain.  Patient reports several cavities and pain for several weeks.  The pain got worse this evening.  He has been taking Aleve at home.  He denies facial swelling, difficulty swallowing, fever or chills, difficulty opening his mouth.  His pain is constant, severe, sharp and throbbing.  Has not seen his dentist.    History reviewed. No pertinent past medical history.  History reviewed. No pertinent surgical history.   Physical Exam   Triage Vital Signs: ED Triage Vitals  Enc Vitals Group     BP 02/21/21 0135 132/87     Pulse Rate 02/21/21 0135 80     Resp 02/21/21 0135 18     Temp 02/21/21 0135 97.7 F (36.5 C)     Temp Source 02/21/21 0135 Oral     SpO2 02/21/21 0135 99 %     Weight 02/21/21 0127 220 lb (99.8 kg)     Height 02/21/21 0127 5\' 9"  (1.753 m)     Head Circumference --      Peak Flow --      Pain Score 02/21/21 0127 8     Pain Loc --      Pain Edu? --      Excl. in GC? --     Most recent vital signs: Vitals:   02/21/21 0135  BP: 132/87  Pulse: 80  Resp: 18  Temp: 97.7 F (36.5 C)  SpO2: 99%     Constitutional: Alert and oriented. Well appearing and in no apparent distress. HEENT:      Head: Normocephalic and atraumatic.         Eyes: Conjunctivae are normal. Sclera is non-icteric.       Mouth/Throat: Mucous membranes are moist.  Floor of the mouth is soft with no induration, no trismus, patient is handling his saliva with no difficulty.  Several cavities including on the 2 right lower molars which is where patient is having a lot of pain.  There is no signs of abscesses      Neck: Supple with no signs of meningismus. Cardiovascular: Regular rate and rhythm. Respiratory: Normal respiratory effort.   Musculoskeletal:  No edema, cyanosis, or erythema of extremities. Neurologic: Normal speech and language. Face is symmetric. Moving all extremities. No gross focal neurologic deficits are appreciated. Skin: Skin is warm, dry and intact. No rash noted. Psychiatric: Mood and affect are normal. Speech and behavior are normal.  ED Results / Procedures / Treatments   Labs (all labs ordered are listed, but only abnormal results are displayed) Labs Reviewed - No data to display   EKG  none   RADIOLOGY none   PROCEDURES:  Critical Care performed: no  Procedures    IMPRESSION / MDM / ASSESSMENT AND PLAN / ED COURSE  I reviewed the triage vital signs and the nursing notes.  31 y.o. male presents for evaluation of dental pain from several cavities.  Has not seen his dentist yet.  Patient is well-appearing with no distress, no trismus, no facial swelling, no induration or swelling of the floor of the mouth or neck, no fever or chills, no systemic symptoms  Ddx: Dental cavities versus tooth abscess.  No signs of Ludewig's angina  Plan: Viscous lidocaine and ibuprofen for pain.   MEDICATIONS GIVEN IN ED: Medications  lidocaine (XYLOCAINE) 2 % viscous mouth solution 15 mL (has no administration in time range)  ibuprofen (ADVIL) tablet 800 mg (has no administration in time range)     ED COURSE: No signs of Ludewig's angina.  Patient is extremely well-appearing.  Was started on viscous lidocaine ibuprofen for pain.  Was given a list of dental providers for close follow-up.  Recommended seeing his dentist for appropriate management.  I did prescribe him a week of amoxicillin and told him that if he is unable to see his dentist for the next week that he can start taking the prescription to prevent an abscess from forming.  Discussed my standard return precautions.   Consults: None   EMR reviewed patient with several prior visits to the ER for dental pain       FINAL  CLINICAL IMPRESSION(S) / ED DIAGNOSES   Final diagnoses:  Pain due to dental caries     Rx / DC Orders   ED Discharge Orders          Ordered    amoxicillin (AMOXIL) 500 MG tablet  2 times daily        02/21/21 0434    lidocaine (XYLOCAINE) 2 % solution  As needed        02/21/21 0434    ibuprofen (ADVIL) 800 MG tablet  Every 8 hours PRN        02/21/21 0434             Note:  This document was prepared using Dragon voice recognition software and may include unintentional dictation errors.   Don Perking, [place], MD 02/21/21 [phone]

## 2023-04-22 ENCOUNTER — Emergency Department
Admission: EM | Admit: 2023-04-22 | Discharge: 2023-04-22 | Disposition: A | Discharge status: Home / Self Care | Payer: Self-pay | Attending: Emergency Medicine | Admitting: Emergency Medicine

## 2023-04-22 ENCOUNTER — Other Ambulatory Visit: Payer: Self-pay

## 2023-04-22 DIAGNOSIS — K0401 Reversible pulpitis: Secondary | ICD-10-CM | POA: Diagnosis not present

## 2023-04-22 DIAGNOSIS — K0889 Other specified disorders of teeth and supporting structures: Secondary | ICD-10-CM

## 2023-04-22 MED ORDER — KETOROLAC TROMETHAMINE 30 MG/ML IJ SOLN
30.0000 mg | Freq: Once | INTRAMUSCULAR | Status: AC
  Administered 2023-04-22: 30 mg via INTRAMUSCULAR
  Filled 2023-04-22: qty 1

## 2023-04-22 MED ORDER — AMOXICILLIN-POT CLAVULANATE 875-125 MG PO TABS: 1.0000 | ORAL_TABLET | Freq: Two times a day (BID) | ORAL | 0 refills | Status: AC

## 2023-04-22 NOTE — ED Triage Notes (Signed)
 Patient reports right lower dental pain for about 2 weeks; has dentist appointment in 2 months.

## 2023-04-22 NOTE — ED Provider Notes (Signed)
 [facility] Provider Note    Event Date/Time   First MD Initiated Contact with Patient 04/22/23 [phone]     (approximate)   History   Chief Complaint Dental Pain   HPI  Thomas Chambers is a 33 y.o. male with no significant past medical history presents to the ED complaining of dental pain.  Patient reports that he has been dealing with waxing and waning pain in the area of his right lower molars for the past 2 weeks.  Pain became acutely worse around 3:00 this morning, making it difficult for him to sleep.  He has noticed some mild swelling to this area, denies any drainage.  He has not had any fevers, does state that pain has been limiting how much he can eat and drink.  He has been taking ibuprofen with partial relief.     Physical Exam   Triage Vital Signs: ED Triage Vitals  Encounter Vitals Group     BP 04/22/23 0853 (!) 141/97     Systolic BP Percentile --      Diastolic BP Percentile --      Pulse Rate 04/22/23 0851 80     Resp 04/22/23 0851 17     Temp 04/22/23 0851 98.2 F (36.8 C)     Temp Source 04/22/23 0851 Oral     SpO2 04/22/23 0851 100 %     Weight 04/22/23 0853 240 lb (108.9 kg)     Height 04/22/23 0853 5\' 10"  (1.778 m)     Head Circumference --      Peak Flow --      Pain Score 04/22/23 0853 9     Pain Loc --      Pain Education --      Exclude from Growth Chart --     Most recent vital signs: Vitals:   04/22/23 0851 04/22/23 0853  BP:  (!) 141/97  Pulse: 80   Resp: 17   Temp: 98.2 F (36.8 C)   SpO2: 100%     Constitutional: Alert and oriented. Eyes: Conjunctivae are normal. Head: Atraumatic. Nose: No congestion/rhinnorhea. Mouth/Throat: Mucous membranes are moist.  Multiple broken right lower molars with erythema and edema, no focal fluctuance noted. Cardiovascular: Normal rate, regular rhythm. Grossly normal heart sounds.  2+ radial pulses bilaterally. Respiratory: Normal respiratory effort.  No retractions. Lungs  CTAB. Gastrointestinal: Soft and nontender. No distention. Musculoskeletal: No lower extremity tenderness nor edema.  Neurologic:  Normal speech and language. No gross focal neurologic deficits are appreciated.    ED Results / Procedures / Treatments   Labs (all labs ordered are listed, but only abnormal results are displayed) Labs Reviewed - No data to display   PROCEDURES:  Critical Care performed: No  Procedures   MEDICATIONS ORDERED IN ED: Medications  ketorolac (TORADOL) 30 MG/ML injection 30 mg (30 mg Intramuscular Given 04/22/23 0914)     IMPRESSION / MDM / ASSESSMENT AND PLAN / ED COURSE  I reviewed the triage vital signs and the nursing notes.                              33 y.o. male with no significant past medical history presents to the ED with 2 weeks of increasing pain to the area of his right lower molars.  Patient's presentation is most consistent with acute, uncomplicated illness.  Differential diagnosis includes, but is not limited to, dental fracture, dental abscess,  pulpitis.  Patient nontoxic-appearing and in no acute distress, vital signs are unremarkable.  He has evidence of developing infection to his right lower molars with some erythema and swelling, but no evidence of abscess seen.  He is appropriate for outpatient management with antibiotics and dentistry follow-up, was given IM Toradol for pain.  He was counseled to return to the ED for new or worsening symptoms.  Patient agrees with plan.      FINAL CLINICAL IMPRESSION(S) / ED DIAGNOSES   Final diagnoses:  Pain, dental  Pulpitis     Rx / DC Orders   ED Discharge Orders          Ordered    amoxicillin-clavulanate (AUGMENTIN) 875-125 MG tablet  2 times daily        04/22/23 0911             Note:  This document was prepared using Dragon voice recognition software and may include unintentional dictation errors.   Chesley Noon, MD 04/22/23 [phone]

## 2023-06-25 ENCOUNTER — Encounter: Payer: Self-pay | Admitting: Emergency Medicine

## 2023-06-25 ENCOUNTER — Emergency Department
Admission: EM | Admit: 2023-06-25 | Discharge: 2023-06-25 | Disposition: A | Discharge status: Home / Self Care | Attending: Emergency Medicine | Admitting: Emergency Medicine

## 2023-06-25 ENCOUNTER — Other Ambulatory Visit: Payer: Self-pay

## 2023-06-25 DIAGNOSIS — R21 Rash and other nonspecific skin eruption: Secondary | ICD-10-CM | POA: Diagnosis present

## 2023-06-25 MED ORDER — PREDNISONE 10 MG (21) PO TBPK: ORAL_TABLET | ORAL | 0 refills | Status: AC

## 2023-06-25 NOTE — ED Triage Notes (Signed)
 Pt via POV from home. Pt c/o rash, pruritus, and redness to bilateral hands that started yesterday. 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 States it started off as a few bumps but the swelling, itching, and bumps has gotten worse overnight. Denies pain. Pt is A&Ox4 and NAD, ambulatory to triage.

## 2023-06-25 NOTE — Discharge Instructions (Signed)
 Your primary care provider if not improving over the week.  Return to the emergency department for symptoms change or worsen if you are unable to schedule an appointment.

## 2023-06-25 NOTE — ED Notes (Addendum)
 See triage notes. Patient c/o rash and blisters to both hands that started yesterday. Patient was working with new metals yesterday.

## 2023-06-25 NOTE — ED Provider Notes (Signed)
 [facility] Provider Note    Event Date/Time   First MD Initiated Contact with Patient 06/25/23 [phone]     (approximate)   History   Rash   HPI  Thomas Chambers is a 33 y.o. male with no significant past medical history and as listed in EMR presents to the emergency department for treatment and evaluation of pruritic rash bilateral hands that started yesterday.  He initially noticed a bump on the left ring finger and then noticed later on that it was progressively spreading over both hands including palms.  Later on he noticed that he had a lesion to the top left great toe.  No previous symptoms similar to this in the past.  He was working with a new piece of machinery yesterday that had been sitting outside and is unsure if it was near any poison oak or poison ivy.  He reports that he has recently had URI symptoms including sore throat but no fever.      Physical Exam   Triage Vital Signs: ED Triage Vitals  Encounter Vitals Group     BP 06/25/23 0909 (!) 154/103     Systolic BP Percentile --      Diastolic BP Percentile --      Pulse Rate 06/25/23 0909 71     Resp 06/25/23 0909 18     Temp 06/25/23 0909 97.9 F (36.6 C)     Temp Source 06/25/23 0909 Oral     SpO2 06/25/23 0909 99 %     Weight 06/25/23 0907 240 lb (108.9 kg)     Height 06/25/23 0907 5\' 10"  (1.778 m)     Head Circumference --      Peak Flow --      Pain Score 06/25/23 0907 0     Pain Loc --      Pain Education --      Exclude from Growth Chart --     Most recent vital signs: Vitals:   06/25/23 0909  BP: (!) 154/103  Pulse: 71  Resp: 18  Temp: 97.9 F (36.6 C)  SpO2: 99%    General: Awake, no distress. CV:  Good peripheral perfusion.  Resp:  Normal effort.  Abd:  No distention.  Other:  No oral lesions.    Vesicals noted on and between fingers. Flat, annular erythematous lesions noted on palms bilaterally. No lesions noted on soles of feet. Singular vesicle noted on left  great toe.   ED Results / Procedures / Treatments   Labs (all labs ordered are listed, but only abnormal results are displayed) Labs Reviewed - No data to display   EKG  Not indicated.   RADIOLOGY  Image and radiology report reviewed and interpreted by me. Radiology report consistent with the same.  Not indicated.  PROCEDURES:  Critical Care performed: No  Procedures   MEDICATIONS ORDERED IN ED:  Medications - No data to display   IMPRESSION / MDM / ASSESSMENT AND PLAN / ED COURSE   I have reviewed the triage note.  Differential diagnosis includes, but is not limited to, contact dermatitis, HFM, scabies  Patient's presentation is most consistent with acute, uncomplicated illness.  33 year old male presenting to the emergency department for treatment and evaluation of pruritic rash noted on his hands yesterday after working with a new piece of equipment.  See HPI for further details.  Exam is inconclusive.  He has both vesicular lesions and flat erythematous annular lesions on his palms.  Plan will be to err on the side of contact dermatitis since this started after touching the equipment.  He will be treated with prednisone  taper.  He was encouraged to follow-up with his primary care provider if not improving over the next few days.  If symptoms change or worsen and he is unable to schedule an appointment he was encouraged to return back to the emergency department.      FINAL CLINICAL IMPRESSION(S) / ED DIAGNOSES   Final diagnoses:  Rash and nonspecific skin eruption     Rx / DC Orders   ED Discharge Orders          Ordered    predniSONE  (STERAPRED UNI-PAK 21 TAB) 10 MG (21) TBPK tablet        06/25/23 0454             Note:  This document was prepared using Dragon voice recognition software and may include unintentional dictation errors.   Sherryle Don, FNP 06/25/23 1310    Shane Darling, MD 06/25/23 1318
